# Patient Record
Sex: Male | Born: 1985 | Race: Black or African American | Hispanic: No | Marital: Single | Smoking: Current every day smoker
Health system: Southern US, Community
[De-identification: ages and names within clinical notes are randomized; demographics above are authoritative.]

## PROBLEM LIST (undated history)

## (undated) ENCOUNTER — Emergency Department (HOSPITAL_COMMUNITY): Admission: EM | Payer: No Typology Code available for payment source | Source: Home / Self Care

## (undated) DIAGNOSIS — J45909 Unspecified asthma, uncomplicated: Secondary | ICD-10-CM

## (undated) HISTORY — PX: APPENDECTOMY: SHX54

---
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
## 2007-05-04 ENCOUNTER — Emergency Department (HOSPITAL_COMMUNITY): Admission: EM | Admit: 2007-05-04 | Discharge: 2007-05-04 | Payer: Self-pay | Admitting: Emergency Medicine

## 2008-03-23 ENCOUNTER — Emergency Department (HOSPITAL_COMMUNITY): Admission: EM | Admit: 2008-03-23 | Discharge: 2008-03-23 | Payer: Self-pay | Admitting: Emergency Medicine

## 2011-06-08 LAB — D-DIMER, QUANTITATIVE: D-Dimer, Quant: 0.25

## 2012-07-13 ENCOUNTER — Encounter (HOSPITAL_COMMUNITY): Payer: Self-pay | Admitting: Emergency Medicine

## 2012-07-13 ENCOUNTER — Emergency Department (HOSPITAL_COMMUNITY): Payer: Self-pay

## 2012-07-13 ENCOUNTER — Emergency Department (HOSPITAL_COMMUNITY)
Admission: EM | Admit: 2012-07-13 | Discharge: 2012-07-13 | Disposition: A | Discharge status: Home / Self Care | Payer: Self-pay | Attending: Emergency Medicine | Admitting: Emergency Medicine

## 2012-07-13 DIAGNOSIS — E860 Dehydration: Secondary | ICD-10-CM | POA: Insufficient documentation

## 2012-07-13 DIAGNOSIS — R109 Unspecified abdominal pain: Secondary | ICD-10-CM

## 2012-07-13 DIAGNOSIS — Z8709 Personal history of other diseases of the respiratory system: Secondary | ICD-10-CM | POA: Insufficient documentation

## 2012-07-13 DIAGNOSIS — F17200 Nicotine dependence, unspecified, uncomplicated: Secondary | ICD-10-CM | POA: Insufficient documentation

## 2012-07-13 DIAGNOSIS — R1084 Generalized abdominal pain: Secondary | ICD-10-CM | POA: Insufficient documentation

## 2012-07-13 DIAGNOSIS — R1110 Vomiting, unspecified: Secondary | ICD-10-CM | POA: Insufficient documentation

## 2012-07-13 HISTORY — DX: Unspecified asthma, uncomplicated: J45.909

## 2012-07-13 LAB — RAPID URINE DRUG SCREEN, HOSP PERFORMED
Amphetamines: NOT DETECTED
Barbiturates: NOT DETECTED
Benzodiazepines: NOT DETECTED
Tetrahydrocannabinol: POSITIVE — AB

## 2012-07-13 LAB — COMPREHENSIVE METABOLIC PANEL
ALT: 26 U/L (ref 0–53)
AST: 27 U/L (ref 0–37)
CO2: 23 mEq/L (ref 19–32)
Calcium: 10.4 mg/dL (ref 8.4–10.5)
Chloride: 103 mEq/L (ref 96–112)
GFR calc Af Amer: >90 mL/min (ref >90)
GFR calc non Af Amer: >90 mL/min (ref >90)
Glucose, Bld: 115 mg/dL — ABNORMAL HIGH (ref 70–99)
Sodium: 141 mEq/L (ref 135–145)
Total Bilirubin: 0.4 mg/dL (ref 0.3–1.2)

## 2012-07-13 LAB — URINALYSIS, ROUTINE W REFLEX MICROSCOPIC
Bilirubin Urine: NEGATIVE
Glucose, UA: NEGATIVE mg/dL
Hgb urine dipstick: NEGATIVE
Ketones, ur: 15 mg/dL — AB
Protein, ur: 30 mg/dL — AB
pH: 8 (ref 5.0–8.0)

## 2012-07-13 LAB — URINE MICROSCOPIC-ADD ON

## 2012-07-13 LAB — CBC WITH DIFFERENTIAL/PLATELET
Basophils Absolute: 0 10*3/uL (ref 0.0–0.1)
Eosinophils Relative: 0 % (ref 0–5)
HCT: 43.5 % (ref 39.0–52.0)
Hemoglobin: 15.5 g/dL (ref 13.0–17.0)
Lymphocytes Relative: 11 % — ABNORMAL LOW (ref 12–46)
Lymphs Abs: 1.5 10*3/uL (ref 0.7–4.0)
MCV: 81.6 fL (ref 78.0–100.0)
Monocytes Absolute: 0.7 10*3/uL (ref 0.1–1.0)
Neutro Abs: 11.6 10*3/uL — ABNORMAL HIGH (ref 1.7–7.7)
Platelets: 221 10*3/uL (ref 150–400)

## 2012-07-13 MED ORDER — MORPHINE SULFATE 4 MG/ML IJ SOLN
4.0000 mg | Freq: Once | INTRAMUSCULAR | Status: AC
  Administered 2012-07-13: 4 mg via INTRAVENOUS
  Filled 2012-07-13: qty 1

## 2012-07-13 MED ORDER — PROMETHAZINE HCL 25 MG/ML IJ SOLN
12.5000 mg | INTRAMUSCULAR | Status: AC
  Administered 2012-07-13: 12.5 mg via INTRAVENOUS
  Filled 2012-07-13: qty 1

## 2012-07-13 MED ORDER — METOCLOPRAMIDE HCL 5 MG/ML IJ SOLN
10.0000 mg | Freq: Once | INTRAMUSCULAR | Status: AC
  Administered 2012-07-13: 10 mg via INTRAVENOUS
  Filled 2012-07-13: qty 2

## 2012-07-13 MED ORDER — DIPHENHYDRAMINE HCL 50 MG/ML IJ SOLN
25.0000 mg | Freq: Once | INTRAMUSCULAR | Status: AC
  Administered 2012-07-13: 25 mg via INTRAVENOUS
  Filled 2012-07-13: qty 1

## 2012-07-13 MED ORDER — PROMETHAZINE HCL 25 MG PO TABS: 25.0000 mg | ORAL_TABLET | Freq: Four times a day (QID) | ORAL | Status: DC | PRN

## 2012-07-13 MED ORDER — SODIUM CHLORIDE 0.9 % IV SOLN
1000.0000 mL | Freq: Once | INTRAVENOUS | Status: AC
  Administered 2012-07-13: 1000 mL via INTRAVENOUS

## 2012-07-13 MED ORDER — SODIUM CHLORIDE 0.9 % IV SOLN
1000.0000 mL | INTRAVENOUS | Status: DC
  Administered 2012-07-13: 1000 mL via INTRAVENOUS

## 2012-07-13 NOTE — ED Notes (Addendum)
Pt in via [facility] from home c/o emesis, diarrhea, abd pain. Pt reports ETOH, approx 6-7 shots, last pm. Pt in in NAD and A&Ox4

## 2012-07-13 NOTE — ED Notes (Signed)
NWG:[id number]> Expected date:07/13/12<BR> Expected time: 8:23 AM<BR> Means of arrival:<BR> Comments:<BR> n/v

## 2012-07-13 NOTE — ED Provider Notes (Signed)
History     CSN: [id number]  Arrival date & time 07/13/12  4098   First MD Initiated Contact with Patient 07/13/12 0845      Chief Complaint  Patient presents with  . Emesis  . Abdominal Pain    (Consider location/radiation/quality/duration/timing/severity/associated sxs/prior treatment) HPI  Patient presents via EMS for abdominal pain that he states started last night. He states he had 4-5 shots of alcohol last night which he states is typical. He reports it started as nausea and then he started having vomiting and states she's vomited at least 10 times. He denies any blood in the vomitus. He denies having diarrhea. He states is having diffuse abdominal pain and cramping for the past 5 hours that is intolerable. He states he's been sweating and feeling hot. He denies feeling weak or dizzy. He states he's never had this before. He denies anything else different in his diet. Nothing makes his pain worse, nothing makes them feel better.  PCP none  Past Medical History  Diagnosis Date  . Asthma     Past Surgical History  Procedure Date  . Appendectomy     No family history on file.  History  Substance Use Topics  . Smoking status: Current Every Day Smoker -- 1.0 packs/day    Types: Cigarettes  . Smokeless tobacco: Not on file  . Alcohol Use: Yes     Comment: occasionally   unemployed   Review of Systems  All other systems reviewed and are negative.    Allergies  Review of patient's allergies indicates no known allergies.  Home Medications  No current outpatient prescriptions on file.  BP 139/55  Pulse 61  Temp 97.8 F (36.6 C) (Oral)  Resp 20  SpO2 100%  Vital signs normal    Physical Exam  Nursing note and vitals reviewed. Constitutional: He is oriented to person, place, and time. He appears well-developed and well-nourished.  Non-toxic appearance. He does not appear ill. He appears distressed.       Patient writhing on the stretcher  HENT:  Head:  Normocephalic and atraumatic.  Right Ear: External ear normal.  Left Ear: External ear normal.  Nose: Nose normal. No mucosal edema or rhinorrhea.  Mouth/Throat: Mucous membranes are normal. No dental abscesses or uvula swelling.       Tongue dry  Eyes: Conjunctivae normal and EOM are normal. Pupils are equal, round, and reactive to light.  Neck: Normal range of motion and full passive range of motion without pain. Neck supple.  Cardiovascular: Normal rate, regular rhythm and normal heart sounds.  Exam reveals no gallop and no friction rub.   No murmur heard. Pulmonary/Chest: Effort normal and breath sounds normal. No respiratory distress. He has no wheezes. He has no rhonchi. He has no rales. He exhibits no tenderness and no crepitus.  Abdominal: Soft. Normal appearance. He exhibits no distension. There is tenderness. There is no rebound and no guarding.       Patient does not have any lower abdominal pain to palpation. He's tender diffusely in the upper abdomen but he is most tender in the epigastric and left upper quadrant. He has diminished bowel sounds diffusely. Patient's abdomen feels very firm and he cannot relax his abdomen. Patient keeps flailing with his legs.  Musculoskeletal: Normal range of motion. He exhibits no edema and no tenderness.       Moves all extremities well.   Neurological: He is alert and oriented to person, place, and time. He has  normal strength. No cranial nerve deficit.  Skin: Skin is warm, dry and intact. No rash noted. No erythema. No pallor.  Psychiatric: His speech is normal and behavior is normal. His mood appears not anxious.       Anxious and agitated    ED Course  Procedures (including critical care time)   Medications  0.9 %  sodium chloride infusion (0 mL Intravenous Stopped 07/13/12 1004)    Followed by  0.9 %  sodium chloride infusion (0 mL Intravenous Stopped 07/13/12 1234)  metoCLOPramide (REGLAN) injection 10 mg (10 mg Intravenous Given  07/13/12 0916)  diphenhydrAMINE (BENADRYL) injection 25 mg (25 mg Intravenous Given 07/13/12 0912)  promethazine (PHENERGAN) injection 12.5 mg (12.5 mg Intravenous Given 07/13/12 0910)  morphine 4 MG/ML injection 4 mg (4 mg Intravenous Given 07/13/12 0914)   Recheck 11:00 pt feeling much better, abdominal cramps gone, nausea gone. Will try oral fluids and if does well, will be able to be discharged.   13:15 has been drinking fluids and feels fine. Watching football on TV in NAD, feels ready to go home.   Results for orders placed during the hospital encounter of 07/13/12  CBC WITH DIFFERENTIAL      Component Value Range   WBC 13.8 (*) 4.0 - 10.5 K/uL   RBC 5.33  4.22 - 5.81 MIL/uL   Hemoglobin 15.5  13.0 - 17.0 g/dL   HCT 98.1  19.1 - 47.8 %   MCV 81.6  78.0 - 100.0 fL   MCH 29.1  26.0 - 34.0 pg   MCHC 35.6  30.0 - 36.0 g/dL   RDW 29.5  62.1 - 30.8 %   Platelets 221  150 - 400 K/uL   Neutrophils Relative 84 (*) 43 - 77 %   Neutro Abs 11.6 (*) 1.7 - 7.7 K/uL   Lymphocytes Relative 11 (*) 12 - 46 %   Lymphs Abs 1.5  0.7 - 4.0 K/uL   Monocytes Relative 5  3 - 12 %   Monocytes Absolute 0.7  0.1 - 1.0 K/uL   Eosinophils Relative 0  0 - 5 %   Eosinophils Absolute 0.0  0.0 - 0.7 K/uL   Basophils Relative 0  0 - 1 %   Basophils Absolute 0.0  0.0 - 0.1 K/uL  COMPREHENSIVE METABOLIC PANEL      Component Value Range   Sodium 141  135 - 145 mEq/L   Potassium 3.8  3.5 - 5.1 mEq/L   Chloride 103  96 - 112 mEq/L   CO2 23  19 - 32 mEq/L   Glucose, Bld 115 (*) 70 - 99 mg/dL   BUN 9  6 - 23 mg/dL   Creatinine, Ser 6.57  0.50 - 1.35 mg/dL   Calcium 84.6  8.4 - 96.2 mg/dL   Total Protein 8.7 (*) 6.0 - 8.3 g/dL   Albumin 4.6  3.5 - 5.2 g/dL   AST 27  0 - 37 U/L   ALT 26  0 - 53 U/L   Alkaline Phosphatase 59  39 - 117 U/L   Total Bilirubin 0.4  0.3 - 1.2 mg/dL   GFR calc non Af Amer >90  >90 mL/min   GFR calc Af Amer >90  >90 mL/min  LIPASE, BLOOD      Component Value Range   Lipase 20   11 - 59 U/L  ETHANOL      Component Value Range   Alcohol, Ethyl (B) <11  0 - 11 mg/dL  URINALYSIS, ROUTINE W REFLEX MICROSCOPIC      Component Value Range   Color, Urine YELLOW  YELLOW   APPearance CLOUDY (*) CLEAR   Specific Gravity, Urine 1.025  1.005 - 1.030   pH 8.0  5.0 - 8.0   Glucose, UA NEGATIVE  NEGATIVE mg/dL   Hgb urine dipstick NEGATIVE  NEGATIVE   Bilirubin Urine NEGATIVE  NEGATIVE   Ketones, ur 15 (*) NEGATIVE mg/dL   Protein, ur 30 (*) NEGATIVE mg/dL   Urobilinogen, UA 0.2  0.0 - 1.0 mg/dL   Nitrite NEGATIVE  NEGATIVE   Leukocytes, UA NEGATIVE  NEGATIVE  URINE RAPID DRUG SCREEN (HOSP PERFORMED)      Component Value Range   Opiates POSITIVE (*) NONE DETECTED   Cocaine NONE DETECTED  NONE DETECTED   Benzodiazepines NONE DETECTED  NONE DETECTED   Amphetamines NONE DETECTED  NONE DETECTED   Tetrahydrocannabinol POSITIVE (*) NONE DETECTED   Barbiturates NONE DETECTED  NONE DETECTED  URINE MICROSCOPIC-ADD ON      Component Value Range   WBC, UA 0-2  <3 WBC/hpf   Bacteria, UA RARE  RARE   Urine-Other MUCOUS PRESENT     Laboratory interpretation all normal except for leukocytosis   Dg Abd Acute W/chest  07/13/2012  *RADIOLOGY REPORT*  Clinical Data: Abdominal pain.  ACUTE ABDOMEN SERIES (ABDOMEN 2 VIEW & CHEST 1 VIEW)  Comparison: Chest x-ray 05/04/2007.  Findings: Lung volumes are normal.  No consolidative airspace disease.  No pleural effusions.  No pneumothorax.  No pulmonary nodule or mass noted.  Pulmonary vasculature and the cardiomediastinal silhouette are within normal limits.  Gas and stool are seen scattered throughout the colon extending to the level of the distal rectum.  No pathologic distension of small bowel is noted.  No gross evidence of pneumoperitoneum.  IMPRESSION: 1.  Nonobstructive bowel gas pattern. 2.  No pneumoperitoneum. 3.  No radiographic evidence of acute cardiopulmonary disease.   Original Report Authenticated By: Trudie Reed, M.D.       1. Vomiting   2. Abdominal pain   3. Dehydration     Plan discharge  New Prescriptions   PROMETHAZINE (PHENERGAN) 25 MG TABLET    Take 1 tablet (25 mg total) by mouth every 6 (six) hours as needed for nausea.     Devoria Albe, MD, FACEP    MDM          Ward Givens, MD 07/13/12 [phone]

## 2012-12-19 ENCOUNTER — Encounter (HOSPITAL_COMMUNITY): Payer: Self-pay | Admitting: *Deleted

## 2012-12-19 ENCOUNTER — Emergency Department (HOSPITAL_COMMUNITY)
Admission: EM | Admit: 2012-12-19 | Discharge: 2012-12-19 | Disposition: A | Discharge status: Home / Self Care | Payer: No Typology Code available for payment source | Attending: Emergency Medicine | Admitting: Emergency Medicine

## 2012-12-19 ENCOUNTER — Emergency Department (HOSPITAL_COMMUNITY): Payer: No Typology Code available for payment source

## 2012-12-19 DIAGNOSIS — S5010XA Contusion of unspecified forearm, initial encounter: Secondary | ICD-10-CM | POA: Insufficient documentation

## 2012-12-19 DIAGNOSIS — Y92410 Unspecified street and highway as the place of occurrence of the external cause: Secondary | ICD-10-CM | POA: Insufficient documentation

## 2012-12-19 DIAGNOSIS — Y9389 Activity, other specified: Secondary | ICD-10-CM | POA: Insufficient documentation

## 2012-12-19 DIAGNOSIS — F17200 Nicotine dependence, unspecified, uncomplicated: Secondary | ICD-10-CM | POA: Insufficient documentation

## 2012-12-19 DIAGNOSIS — S8010XA Contusion of unspecified lower leg, initial encounter: Secondary | ICD-10-CM | POA: Insufficient documentation

## 2012-12-19 DIAGNOSIS — T148XXA Other injury of unspecified body region, initial encounter: Secondary | ICD-10-CM

## 2012-12-19 DIAGNOSIS — J45909 Unspecified asthma, uncomplicated: Secondary | ICD-10-CM | POA: Insufficient documentation

## 2012-12-19 MED ORDER — HYDROCODONE-ACETAMINOPHEN 5-325 MG PO TABS: 2.0000 | ORAL_TABLET | ORAL | Status: DC | PRN

## 2012-12-19 MED ORDER — METHOCARBAMOL 500 MG PO TABS: 500.0000 mg | ORAL_TABLET | Freq: Two times a day (BID) | ORAL | Status: DC

## 2012-12-19 MED ORDER — PROMETHAZINE HCL 25 MG PO TABS: 25.0000 mg | ORAL_TABLET | Freq: Four times a day (QID) | ORAL | Status: DC | PRN
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
## 2012-12-20 NOTE — ED Provider Notes (Signed)
Medical screening examination/treatment/procedure(s) were performed by non-physician practitioner and as supervising physician I was immediately available for consultation/collaboration.   Glynn Octave, MD 12/20/12 1446

## 2013-01-20 ENCOUNTER — Emergency Department (HOSPITAL_COMMUNITY)
Admission: EM | Admit: 2013-01-20 | Discharge: 2013-01-20 | Disposition: A | Discharge status: Home / Self Care | Payer: Self-pay | Attending: Emergency Medicine | Admitting: Emergency Medicine

## 2013-01-20 ENCOUNTER — Encounter (HOSPITAL_COMMUNITY): Payer: Self-pay | Admitting: *Deleted

## 2013-01-20 DIAGNOSIS — K047 Periapical abscess without sinus: Secondary | ICD-10-CM | POA: Insufficient documentation

## 2013-01-20 DIAGNOSIS — K089 Disorder of teeth and supporting structures, unspecified: Secondary | ICD-10-CM | POA: Insufficient documentation

## 2013-01-20 DIAGNOSIS — K0889 Other specified disorders of teeth and supporting structures: Secondary | ICD-10-CM

## 2013-01-20 DIAGNOSIS — J45909 Unspecified asthma, uncomplicated: Secondary | ICD-10-CM | POA: Insufficient documentation

## 2013-01-20 DIAGNOSIS — F17200 Nicotine dependence, unspecified, uncomplicated: Secondary | ICD-10-CM | POA: Insufficient documentation

## 2013-01-20 MED ORDER — HYDROCODONE-ACETAMINOPHEN 5-325 MG PO TABS: 1.0000 | ORAL_TABLET | ORAL | Status: DC | PRN

## 2013-01-20 MED ORDER — PENICILLIN V POTASSIUM 500 MG PO TABS: 500.0000 mg | ORAL_TABLET | Freq: Four times a day (QID) | ORAL | Status: AC

## 2013-01-20 NOTE — ED Provider Notes (Signed)
History     CSN: [id number]  Arrival date & time 01/20/13  1918   First MD Initiated Contact with Patient 01/20/13 2112      Chief Complaint  Patient presents with  . Dental Pain   HPI  History provided by patient. Patient is a 27 year old male with past history of asthma who is a current everyday smoker and presents with complaints of lower dental and mouth pain and swelling. Patient states he occasionally has some intermittent problems to his left lower mouth.last evening he began having some pain and swelling to the dome and child.  Patient to use someone compresses today and states she was able to push and get some pus and blood out.  He denies the use significant pain.  Denies any difficulty swallowing or breathing.  Denies any fever, chills or sweats.  No other aggravating or alleviating factors.  No other associated symptoms.    Past Medical History  Diagnosis Date  . Asthma     Past Surgical History  Procedure Laterality Date  . Appendectomy      No family history on file.  History  Substance Use Topics  . Smoking status: Current Every Day Smoker -- 1.00 packs/day    Types: Cigarettes  . Smokeless tobacco: Not on file  . Alcohol Use: Yes     Comment: occasionally      Review of Systems  Constitutional: Negative for fever, chills and diaphoresis.  HENT: Positive for facial swelling and dental problem. Negative for sore throat, trouble swallowing and voice change.   Gastrointestinal: Negative for nausea and vomiting.  All other systems reviewed and are negative.    Allergies  Review of patient's allergies indicates no known allergies.  Home Medications   Current Outpatient Rx  Name  Route  Sig  Dispense  Refill  . ibuprofen (ADVIL,MOTRIN) 200 MG tablet   Oral   Take 200 mg by mouth every 6 (six) hours as needed for pain.         . Multiple Vitamin (MULTIVITAMIN WITH MINERALS) TABS   Oral   Take 1 tablet by mouth daily.         Marland Kitchen  HYDROcodone-acetaminophen (NORCO) 5-325 MG per tablet   Oral   Take 1 tablet by mouth every 4 (four) hours as needed for pain.   20 tablet   0   . penicillin v potassium (VEETID) 500 MG tablet   Oral   Take 1 tablet (500 mg total) by mouth 4 (four) times daily.   40 tablet   0     BP 126/64  Pulse 69  Temp(Src) 98.1 F (36.7 C)  Resp 18  SpO2 98%  Physical Exam  Nursing note and vitals reviewed. Constitutional: He is oriented to person, place, and time. He appears well-developed and well-nourished.  HENT:  Head: Normocephalic.  Patient with significant decay of fractures of the molar teeth a little left lower aspect to the gumline. There is significant swelling to the lateral aspect of gums and into the face. No erythema. Mild tenderness. Small amount of purulent drainage is expressed up through the root of the location of the second molar. No bleeding.  Neck: Normal range of motion. Neck supple.  Cardiovascular: Normal rate and regular rhythm.   Pulmonary/Chest: Effort normal and breath sounds normal.  Abdominal: Soft.  Lymphadenopathy:    He has no cervical adenopathy.  Neurological: He is alert and oriented to person, place, and time.  Skin: Skin is warm.  Psychiatric: He has a normal mood and affect. His behavior is normal.    ED Course  Procedures      1. Dental abscess   2. Pain, dental       MDM  Patient seen and evaluated. Patient appears well and nontoxic in no acute distress. Symptoms consistent with left-sided dental abscess. Patient offered I&D but refused. We'll provide antibiotics and dental referral tomorrow. Patient agrees and will call dentist office. He was given strict return precautions.        Angus Seller, PA-C 01/20/13 2223

## 2013-01-20 NOTE — ED Provider Notes (Signed)
Medical screening examination/treatment/procedure(s) were performed by non-physician practitioner and as supervising physician I was immediately available for consultation/collaboration.   Katriana Dortch B. Sean Macwilliams, MD 01/20/13 2338 

## 2013-01-20 NOTE — ED Notes (Signed)
Pt is here with left lower dental pain and swelling

## 2013-10-12 ENCOUNTER — Emergency Department (HOSPITAL_COMMUNITY)
Admission: EM | Admit: 2013-10-12 | Discharge: 2013-10-12 | Disposition: A | Discharge status: Home / Self Care | Payer: No Typology Code available for payment source | Attending: Emergency Medicine | Admitting: Emergency Medicine

## 2013-10-12 ENCOUNTER — Encounter (HOSPITAL_COMMUNITY): Payer: Self-pay | Admitting: Emergency Medicine

## 2013-10-12 DIAGNOSIS — M545 Low back pain, unspecified: Secondary | ICD-10-CM

## 2013-10-12 DIAGNOSIS — F17200 Nicotine dependence, unspecified, uncomplicated: Secondary | ICD-10-CM | POA: Insufficient documentation

## 2013-10-12 DIAGNOSIS — R51 Headache: Secondary | ICD-10-CM

## 2013-10-12 DIAGNOSIS — S0990XA Unspecified injury of head, initial encounter: Secondary | ICD-10-CM | POA: Insufficient documentation

## 2013-10-12 DIAGNOSIS — R519 Headache, unspecified: Secondary | ICD-10-CM

## 2013-10-12 DIAGNOSIS — Y9389 Activity, other specified: Secondary | ICD-10-CM | POA: Insufficient documentation

## 2013-10-12 DIAGNOSIS — Y92410 Unspecified street and highway as the place of occurrence of the external cause: Secondary | ICD-10-CM | POA: Insufficient documentation

## 2013-10-12 DIAGNOSIS — IMO0002 Reserved for concepts with insufficient information to code with codable children: Secondary | ICD-10-CM | POA: Insufficient documentation

## 2013-10-12 DIAGNOSIS — Z791 Long term (current) use of non-steroidal anti-inflammatories (NSAID): Secondary | ICD-10-CM | POA: Insufficient documentation

## 2013-10-12 DIAGNOSIS — J45909 Unspecified asthma, uncomplicated: Secondary | ICD-10-CM | POA: Insufficient documentation

## 2013-10-12 MED ORDER — IBUPROFEN 800 MG PO TABS: 800.0000 mg | ORAL_TABLET | Freq: Three times a day (TID) | ORAL | Status: DC

## 2013-10-12 NOTE — Discharge Instructions (Signed)
Call for a follow up appointment with a Family or Primary Care Provider.  °Return if Symptoms worsen.   °Take medication as prescribed.  ° °
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
## 2013-10-14 NOTE — ED Provider Notes (Signed)
Medical screening examination/treatment/procedure(s) were performed by non-physician practitioner and as supervising physician I was immediately available for consultation/collaboration.  EKG Interpretation   None        Hurman HornJohn M Ronnika Collett, MD 10/14/13 1919
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
## 2013-10-29 NOTE — ED Provider Notes (Signed)
Medical screening examination/treatment/procedure(s) were performed by non-physician practitioner and as supervising physician I was immediately available for consultation/collaboration.   Lucero Ide M Deliliah Spranger, MD 10/29/13 2028 

## 2014-07-09 ENCOUNTER — Emergency Department (HOSPITAL_COMMUNITY)
Admission: EM | Admit: 2014-07-09 | Discharge: 2014-07-10 | Disposition: A | Discharge status: Home / Self Care | Payer: No Typology Code available for payment source | Attending: Emergency Medicine | Admitting: Emergency Medicine

## 2014-07-09 ENCOUNTER — Encounter (HOSPITAL_COMMUNITY): Payer: Self-pay | Admitting: Emergency Medicine

## 2014-07-09 ENCOUNTER — Emergency Department (HOSPITAL_COMMUNITY)
Admission: EM | Admit: 2014-07-09 | Discharge: 2014-07-09 | Discharge status: Against Medical Advice | Payer: No Typology Code available for payment source | Attending: Emergency Medicine | Admitting: Emergency Medicine

## 2014-07-09 DIAGNOSIS — M545 Low back pain, unspecified: Secondary | ICD-10-CM

## 2014-07-09 DIAGNOSIS — J45909 Unspecified asthma, uncomplicated: Secondary | ICD-10-CM | POA: Insufficient documentation

## 2014-07-09 DIAGNOSIS — Z720 Tobacco use: Secondary | ICD-10-CM | POA: Insufficient documentation

## 2014-07-09 LAB — URINALYSIS, ROUTINE W REFLEX MICROSCOPIC
Bilirubin Urine: NEGATIVE
Glucose, UA: NEGATIVE mg/dL
Hgb urine dipstick: NEGATIVE
LEUKOCYTES UA: NEGATIVE
NITRITE: NEGATIVE
PH: 7.5 (ref 5.0–8.0)
Protein, ur: NEGATIVE mg/dL
SPECIFIC GRAVITY, URINE: 1.025 (ref 1.005–1.030)
Urobilinogen, UA: 1 mg/dL (ref 0.0–1.0)

## 2014-07-09 MED ORDER — IBUPROFEN 800 MG PO TABS: 800.0000 mg | ORAL_TABLET | Freq: Three times a day (TID) | ORAL | Status: DC | PRN

## 2014-07-09 MED ORDER — FENTANYL CITRATE 0.05 MG/ML IJ SOLN
50.0000 ug | Freq: Once | INTRAMUSCULAR | Status: DC
  Filled 2014-07-09: qty 2

## 2014-07-09 MED ORDER — IBUPROFEN 800 MG PO TABS: 800.0000 mg | ORAL_TABLET | Freq: Three times a day (TID) | ORAL | Status: AC | PRN

## 2014-07-09 MED ORDER — OXYCODONE-ACETAMINOPHEN 5-325 MG PO TABS
1.0000 | ORAL_TABLET | Freq: Once | ORAL | Status: AC
  Administered 2014-07-09: 1 via ORAL
  Filled 2014-07-09: qty 1

## 2014-07-09 MED ORDER — CYCLOBENZAPRINE HCL 5 MG PO TABS: 5.0000 mg | ORAL_TABLET | Freq: Three times a day (TID) | ORAL | Status: AC | PRN

## 2014-07-09 MED ORDER — ONDANSETRON 4 MG PO TBDP
8.0000 mg | ORAL_TABLET | Freq: Once | ORAL | Status: AC
  Administered 2014-07-09: 8 mg via ORAL
  Filled 2014-07-09: qty 2

## 2014-07-09 MED ORDER — CYCLOBENZAPRINE HCL 5 MG PO TABS: 5.0000 mg | ORAL_TABLET | Freq: Three times a day (TID) | ORAL | Status: DC | PRN

## 2014-07-09 NOTE — ED Notes (Signed)
Pt cursing in triage waiting area. Pt sts he is leaving and going to [facility].

## 2014-07-09 NOTE — ED Notes (Signed)
Pt states he was seen at [facility] and received a percocet for lower back pain. Pt states he has had the back pain x2 days. Denies any type of injury. [facility] said [facility] was a long wait for a room. Pt is alert and oriented.

## 2014-07-09 NOTE — ED Notes (Signed)
Pt reports cold this week. Yesterday woke up with lower back pain. Pt denies urinary sx. sts he has had nv today.

## 2014-07-09 NOTE — ED Provider Notes (Signed)
CSN: [id number]     Arrival date & time 07/09/14  2215 History  This chart was scribed for a non-physician practitioner, Earley FavorGail Karmen Altamirano, FNP, working with Donnetta HutchingBrian Cook, MD by Julian HyMorgan Graham, ED Scribe. The patient was seen in WTR[facility]TR[facility]. The patient's care was started at 10:54 PM.   Chief Complaint  Patient presents with  . Back Pain   The history is provided by the patient. No language interpreter was used.   HPI Comments: Margot ChimesReginald D Bray is a 28 y.o. male who presents to the Emergency Department complaining of new, moderate, gradually worsening back pain onset two days ago. He denies injury or trauma. Pt states he felt like his pain may be due to his kidneys. Pt notes his pain was worsened with a recent cold. Pt took Alka-seltzer Plus. He notes his cold is resolved but his back pain is still persistent. Pt denies dysuria.  Past Medical History  Diagnosis Date  . Asthma    Past Surgical History  Procedure Laterality Date  . Appendectomy     History reviewed. No pertinent family history. History  Substance Use Topics  . Smoking status: Current Every Day Smoker -- 1.00 packs/day    Types: Cigarettes  . Smokeless tobacco: Not on file  . Alcohol Use: Yes     Comment: occasionally    Review of Systems  Constitutional: Negative for fever and chills.  Respiratory: Negative for shortness of breath.   Gastrointestinal: Negative for nausea and vomiting.  Genitourinary: Negative for dysuria.  Musculoskeletal: Positive for back pain.  Neurological: Negative for weakness.  All other systems reviewed and are negative.    Allergies  Review of patient's allergies indicates no known allergies.  Home Medications   Prior to Admission medications   Medication Sig Start Date End Date Taking? Authorizing Provider  cyclobenzaprine (FLEXERIL) 5 MG tablet Take 1 tablet (5 mg total) by mouth 3 (three) times daily as needed for muscle spasms. 07/09/14   Arman FilterGail K Madelon Welsch, NP  ibuprofen (ADVIL,MOTRIN)  800 MG tablet Take 1 tablet (800 mg total) by mouth every 8 (eight) hours as needed. Take with meals 07/09/14   Arman FilterGail K Arsenio Schnorr, NP   Triage Vitals: BP 102/56 mmHg  Pulse 78  Temp(Src) 99.2 F (37.3 C) (Oral)  Resp 15  SpO2 94%  Physical Exam  Constitutional: He is oriented to person, place, and time. He appears well-developed and well-nourished. No distress.  HENT:  Head: Normocephalic and atraumatic.  Eyes: Conjunctivae and EOM are normal.  Neck: Neck supple. No tracheal deviation present.  Cardiovascular: Normal rate.   Pulmonary/Chest: Effort normal. No respiratory distress.  Musculoskeletal: Normal range of motion. He exhibits no tenderness.  Neurological: He is alert and oriented to person, place, and time.  Skin: Skin is warm and dry.  Psychiatric: He has a normal mood and affect. His behavior is normal.  Nursing note and vitals reviewed.   ED Course  Procedures (including critical care time) DIAGNOSTIC STUDIES: Oxygen Saturation is 94% on RA, adequate by my interpretation.    COORDINATION OF CARE: 10:56 PM- Patient informed of current plan for treatment and evaluation and agrees with plan at this time.  Labs Review Labs Reviewed  URINALYSIS, ROUTINE W REFLEX MICROSCOPIC - Abnormal; Notable for the following:    Ketones, ur >80 (*)    All other components within normal limits    Imaging Review No results found.   MDM   Final diagnoses:  Bilateral low back pain without sciatica  I personally performed the services described in this documentation, which was scribed in my presence. The recorded information has been reviewed and is accurate.   Arman FilterGail K Dandrae Kustra, NP 07/09/14 2340  Donnetta HutchingBrian Cook, MD 07/10/14 [phone]
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
## 2023-11-12 ENCOUNTER — Emergency Department (HOSPITAL_COMMUNITY)
Admission: EM | Admit: 2023-11-12 | Discharge: 2023-11-13 | Disposition: A | Discharge status: Home / Self Care | Payer: Self-pay | Attending: Emergency Medicine | Admitting: Emergency Medicine

## 2023-11-12 ENCOUNTER — Other Ambulatory Visit: Payer: Self-pay

## 2023-11-12 ENCOUNTER — Encounter (HOSPITAL_COMMUNITY): Payer: Self-pay

## 2023-11-12 ENCOUNTER — Emergency Department (HOSPITAL_COMMUNITY)
Admission: EM | Admit: 2023-11-12 | Discharge: 2023-11-12 | Discharge status: Against Medical Advice | Payer: Self-pay | Attending: Emergency Medicine | Admitting: Emergency Medicine

## 2023-11-12 DIAGNOSIS — K047 Periapical abscess without sinus: Secondary | ICD-10-CM | POA: Insufficient documentation

## 2023-11-12 DIAGNOSIS — K029 Dental caries, unspecified: Secondary | ICD-10-CM | POA: Insufficient documentation

## 2023-11-12 DIAGNOSIS — K0889 Other specified disorders of teeth and supporting structures: Secondary | ICD-10-CM | POA: Insufficient documentation

## 2023-11-12 DIAGNOSIS — Z5321 Procedure and treatment not carried out due to patient leaving prior to being seen by health care provider: Secondary | ICD-10-CM | POA: Insufficient documentation

## 2023-11-12 NOTE — ED Provider Triage Note (Signed)
 Emergency Medicine Provider Triage Evaluation Note  Jim Bray , a 38 y.o. male  was evaluated in triage.  Pt complains of dental pain   Review of Systems  Positive: Mild swelling to right upper face, pain Negative: Fever, chills, drainage, shortness of breath  Physical Exam  BP (!) 151/111 (BP Location: Right Arm)   Pulse (!) 59   Temp 98.2 F (36.8 C)   Resp 18   Ht 5\' 9"  (1.753 m)   Wt 74.8 kg   SpO2 98%   BMI 24.37 kg/m  Gen:   Awake, no distress   Resp:  Normal effort  MSK:   Moves extremities without difficulty  Other:  Mild swelling to right maxilla, no appreciable abscess on exam, patient has pain with opening mouth, no sublingual swelling  Medical Decision Making  Medically screening exam initiated at 6:24 PM.  Appropriate orders placed.  Margot Chimes was informed that the remainder of the evaluation will be completed by another provider, this initial triage assessment does not replace that evaluation, and the importance of remaining in the ED until their evaluation is complete.     Dolphus Jenny, PA-C 11/12/23 1829

## 2023-11-12 NOTE — ED Triage Notes (Signed)
 Pt reports 2 dental abscess on right upper mouth x 2-3 days , c/o pain 8/10. Notable swelling to right side face, airway patent, pt talking in full sentences and cussing, ,RR even and unlabored

## 2023-11-12 NOTE — ED Triage Notes (Signed)
 Pt c/o pain on right side of inside of mouthx2d. Pt states he has a bump on the right inside of mouth. Pt has 1+ swelling of right side of face.

## 2023-11-13 MED ORDER — AMOXICILLIN-POT CLAVULANATE 875-125 MG PO TABS
1.0000 | ORAL_TABLET | Freq: Once | ORAL | Status: AC
  Administered 2023-11-13: 1 via ORAL
  Filled 2023-11-13: qty 1

## 2023-11-13 MED ORDER — AMOXICILLIN-POT CLAVULANATE 875-125 MG PO TABS: 1.0000 | ORAL_TABLET | Freq: Two times a day (BID) | ORAL | 0 refills | Status: AC

## 2023-11-13 MED ORDER — KETOROLAC TROMETHAMINE 10 MG PO TABS: 10.0000 mg | ORAL_TABLET | Freq: Four times a day (QID) | ORAL | 0 refills | Status: AC | PRN

## 2023-11-13 MED ORDER — OXYCODONE-ACETAMINOPHEN 5-325 MG PO TABS
1.0000 | ORAL_TABLET | Freq: Once | ORAL | Status: AC
  Administered 2023-11-13: 1 via ORAL
  Filled 2023-11-13: qty 1

## 2023-11-13 MED ORDER — AMOXICILLIN-POT CLAVULANATE 875-125 MG PO TABS: 1.0000 | ORAL_TABLET | Freq: Two times a day (BID) | ORAL | 0 refills | Status: DC

## 2023-11-13 NOTE — ED Provider Notes (Signed)
 [place] EMERGENCY DEPARTMENT AT [facility] Provider Note   CSN: [id number] Arrival date & time: 11/12/23  2153     History Chief Complaint  Patient presents with   Dental Pain    Jim Bray is a 38 y.o. male.  Patient without significant medical history presents to the emergency department today with concerns of dental pain.  He reports that he has had an abscess present in the right upper side of his mouth for about 2 to 3 days.  Endorses pain present almost at all times with denies any dysphagia or difficulty eating or swallowing.  Patient denies any feelings of throat swelling or chest tightness.  Denies any vision changes.   Dental Pain      Home Medications Prior to Admission medications   Medication Sig Start Date End Date Taking? Authorizing Provider  ketorolac (TORADOL) 10 MG tablet Take 1 tablet (10 mg total) by mouth every 6 (six) hours as needed. 11/13/23  Yes Maryanna Shape A, PA-C  amoxicillin-clavulanate (AUGMENTIN) 875-125 MG tablet Take 1 tablet by mouth every 12 (twelve) hours for 10 days. 11/13/23 11/23/23  Smitty Knudsen, PA-C  cyclobenzaprine (FLEXERIL) 5 MG tablet Take 1 tablet (5 mg total) by mouth 3 (three) times daily as needed for muscle spasms. 07/09/14   Earley Favor, NP  ibuprofen (ADVIL,MOTRIN) 800 MG tablet Take 1 tablet (800 mg total) by mouth every 8 (eight) hours as needed. Take with meals 07/09/14   Earley Favor, NP      Allergies    Patient has no known allergies.    Review of Systems   Review of Systems  HENT:  Positive for dental problem.   All other systems reviewed and are negative.   Physical Exam Updated Vital Signs BP (!) 139/92 (BP Location: Right Arm)   Pulse 66   Temp 97.9 F (36.6 C) (Oral)   Resp 17   SpO2 98%  Physical Exam Vitals and nursing note reviewed.  Constitutional:      General: He is not in acute distress.    Appearance: He is well-developed.  HENT:     Head: Normocephalic and atraumatic.      Mouth/Throat:     Mouth: Mucous membranes are moist.     Pharynx: No oropharyngeal exudate or posterior oropharyngeal erythema.     Comments: No clear dental abscess or gum boil present along the gumline of the right upper mouth.  There is significant swelling in this area so consistent findings for abscess.  Poor dentition with multiple sites of dental caries.  No partially or fully acutely avulsed teeth noted. Eyes:     Conjunctiva/sclera: Conjunctivae normal.  Cardiovascular:     Rate and Rhythm: Normal rate and regular rhythm.     Heart sounds: No murmur heard. Pulmonary:     Effort: Pulmonary effort is normal. No respiratory distress.     Breath sounds: Normal breath sounds.  Abdominal:     Palpations: Abdomen is soft.     Tenderness: There is no abdominal tenderness.  Musculoskeletal:        General: No swelling.     Cervical back: Neck supple.  Skin:    General: Skin is warm and dry.     Capillary Refill: Capillary refill takes less than 2 seconds.  Neurological:     Mental Status: He is alert.  Psychiatric:        Mood and Affect: Mood normal.     ED Results / Procedures /  Treatments   Labs (all labs ordered are listed, but only abnormal results are displayed) Labs Reviewed - No data to display  EKG None  Radiology No results found.  Procedures Procedures    Medications Ordered in ED Medications  amoxicillin-clavulanate (AUGMENTIN) 875-125 MG per tablet 1 tablet (1 tablet Oral Given 11/13/23 0027)  oxyCODONE-acetaminophen (PERCOCET/ROXICET) 5-325 MG per tablet 1 tablet (1 tablet Oral Given 11/13/23 0027)    ED Course/ Medical Decision Making/ A&P                                 Medical Decision Making Risk Prescription drug management.   This patient presents to the ED for concern of dental pain.  Differential diagnosis includes tooth avulsion, dental caries    Medicines ordered and prescription drug management:  I ordered medication  including Augmentin, Percocet for dental infection, pain Reevaluation of the patient after these medicines showed that the patient improved I have reviewed the patients home medicines and have made adjustments as needed   Problem List / ED Course:  Patient presents the emergency department today with concerns of dental pain.  Reports he has had an area of swelling for the last 2 or 3 days.  This is all localized to the right upper side of his mouth.  Denies any recent drainage or discharge.  No recent fever chills or bodyaches.  Denies any shortness of breath or difficulty swallowing. Physical exam does appear to be consistent with a dental abscess with notable swelling to the right upper mouth.  No appreciable gum boil or fluctuance in the gumline.  No evidence dental avulsion there is significant dental decay and a dental caries present throughout the mouth.  Patient reports last was seen by dentist was about 26 years ago.  Will provide patient antibiotic therapy with 10 days of Augmentin and #20 of Toradol sent to pharmacy for pain management.  Will also provide patient with dental resource guide for her to the management outpatient.  Advised strict return precautions to patient.  Patient agreeable with current treatment plan.  Verbalized understanding return precautions.  Patient otherwise stable for outpatient follow-up and discharged home.  Final Clinical Impression(s) / ED Diagnoses Final diagnoses:  Dental abscess    Rx / DC Orders ED Discharge Orders          Ordered    amoxicillin-clavulanate (AUGMENTIN) 875-125 MG tablet  Every 12 hours,   Status:  Discontinued        11/13/23 0023    ketorolac (TORADOL) 10 MG tablet  Every 6 hours PRN        11/13/23 0023    amoxicillin-clavulanate (AUGMENTIN) 875-125 MG tablet  Every 12 hours        11/13/23 0027              Smitty Knudsen, PA-C 11/13/23 0028    Nira Conn, MD 11/13/23 [phone]

## 2023-11-13 NOTE — Discharge Instructions (Addendum)
 You are seen in the emergency department today for concerns of dental pain.  You have a dental abscess present on the right upper side of your mouth.  You need to be seen by dentist for definitive treatment.  I have started you on antibiotic therapy to help reduce the irritation from this infection.  I have also sent a strong anti-inflammatory medication called Toradol to your pharmacy.  While you are on this medication, you should not take ibuprofen or naproxen.  You may take Tylenol on top of the Toradol if needed.  Please return to the emergency department for any concerns of new or worsening symptoms.
